# Patient Record
Sex: Male | Born: 1945 | Race: White | Hispanic: No
Health system: Southern US, Community
[De-identification: ages and names within clinical notes are randomized; demographics above are authoritative.]

---

## 2011-03-11 ENCOUNTER — Ambulatory Visit: Payer: Self-pay | Admitting: Internal Medicine

## 2011-05-04 ENCOUNTER — Ambulatory Visit: Payer: Self-pay | Admitting: Oncology

## 2011-05-07 ENCOUNTER — Ambulatory Visit: Payer: Self-pay | Admitting: Internal Medicine

## 2011-05-10 ENCOUNTER — Ambulatory Visit: Payer: Self-pay | Admitting: Internal Medicine

## 2011-05-13 ENCOUNTER — Ambulatory Visit: Payer: Self-pay | Admitting: Oncology

## 2011-05-20 ENCOUNTER — Ambulatory Visit: Payer: Self-pay | Admitting: Oncology

## 2011-05-24 LAB — PATHOLOGY REPORT

## 2011-06-03 ENCOUNTER — Ambulatory Visit: Payer: Self-pay | Admitting: Oncology

## 2011-07-04 ENCOUNTER — Ambulatory Visit: Payer: Self-pay | Admitting: Oncology

## 2011-08-03 ENCOUNTER — Ambulatory Visit: Payer: Self-pay | Admitting: Oncology

## 2011-09-03 ENCOUNTER — Ambulatory Visit: Payer: Self-pay | Admitting: Oncology

## 2011-09-05 LAB — PROTIME-INR
INR: 1.7
Prothrombin Time: 19.7 secs — ABNORMAL HIGH (ref 11.5–14.7)

## 2011-09-12 LAB — PROTIME-INR
INR: 2
Prothrombin Time: 22.6 secs — ABNORMAL HIGH (ref 11.5–14.7)

## 2011-09-19 LAB — PROTIME-INR: INR: 2.1

## 2011-09-26 LAB — CBC CANCER CENTER
Basophil #: 0 x10 3/mm (ref 0.0–0.1)
Eosinophil %: 14.7 %
HGB: 12.3 g/dL — ABNORMAL LOW (ref 13.0–18.0)
Lymphocyte #: 0.3 x10 3/mm — ABNORMAL LOW (ref 1.0–3.6)
Lymphocyte %: 13.7 %
MCH: 31.3 pg (ref 26.0–34.0)
Monocyte #: 0.3 x10 3/mm (ref 0.0–0.7)
Neutrophil %: 54.2 %
Platelet: 188 x10 3/mm (ref 150–440)
RBC: 3.94 10*6/uL — ABNORMAL LOW (ref 4.40–5.90)
WBC: 2 x10 3/mm — CL (ref 3.8–10.6)

## 2011-09-26 LAB — COMPREHENSIVE METABOLIC PANEL
Alkaline Phosphatase: 86 U/L (ref 50–136)
Bilirubin,Total: 0.4 mg/dL (ref 0.2–1.0)
Calcium, Total: 8 mg/dL — ABNORMAL LOW (ref 8.5–10.1)
Chloride: 102 mmol/L (ref 98–107)
Co2: 28 mmol/L (ref 21–32)
EGFR (African American): >60
EGFR (Non-African Amer.): >60
Glucose: 114 mg/dL — ABNORMAL HIGH (ref 65–99)
Osmolality: 276 (ref 275–301)
SGOT(AST): 18 U/L (ref 15–37)
SGPT (ALT): 23 U/L
Sodium: 138 mmol/L (ref 136–145)

## 2011-09-26 LAB — PROTIME-INR
INR: 2.5
Prothrombin Time: 27.3 secs — ABNORMAL HIGH (ref 11.5–14.7)

## 2011-09-27 LAB — PROT IMMUNOELECTROPHORES(ARMC)

## 2011-10-03 LAB — PROTIME-INR: INR: 2

## 2011-10-04 ENCOUNTER — Ambulatory Visit: Payer: Self-pay | Admitting: Oncology

## 2011-10-10 LAB — PROTIME-INR
INR: 3.5
Prothrombin Time: 34.8 secs — ABNORMAL HIGH (ref 11.5–14.7)

## 2011-10-23 LAB — CBC CANCER CENTER
Basophil #: 0 x10 3/mm (ref 0.0–0.1)
Basophil %: 0.2 %
Eosinophil #: 0 x10 3/mm (ref 0.0–0.7)
Eosinophil %: 0.2 %
HGB: 11.6 g/dL — ABNORMAL LOW (ref 13.0–18.0)
Lymphocyte %: 12.9 %
Lymphocytes: 11 %
MCH: 30.7 pg (ref 26.0–34.0)
MCHC: 34.3 g/dL (ref 32.0–36.0)
Monocytes: 7 %
Neutrophil %: 83.1 %
RDW: 18.1 % — ABNORMAL HIGH (ref 11.5–14.5)

## 2011-10-24 LAB — CBC CANCER CENTER
Basophil #: 0 x10 3/mm (ref 0.0–0.1)
Eosinophil #: 0 x10 3/mm (ref 0.0–0.7)
HCT: 32 % — ABNORMAL LOW (ref 40.0–52.0)
Lymphocyte %: 11.8 %
MCHC: 33.7 g/dL (ref 32.0–36.0)
Monocyte %: 8.3 %
Neutrophil #: 2.4 x10 3/mm (ref 1.4–6.5)
Neutrophil %: 78.2 %
Platelet: 105 x10 3/mm — ABNORMAL LOW (ref 150–440)
RDW: 18.1 % — ABNORMAL HIGH (ref 11.5–14.5)
WBC: 3.1 x10 3/mm — ABNORMAL LOW (ref 3.8–10.6)

## 2011-10-24 LAB — COMPREHENSIVE METABOLIC PANEL
Albumin: 2.4 g/dL — ABNORMAL LOW (ref 3.4–5.0)
Anion Gap: 8 (ref 7–16)
Calcium, Total: 8.3 mg/dL — ABNORMAL LOW (ref 8.5–10.1)
Chloride: 101 mmol/L (ref 98–107)
Co2: 27 mmol/L (ref 21–32)
EGFR (African American): >60
EGFR (Non-African Amer.): 56 — ABNORMAL LOW
Glucose: 169 mg/dL — ABNORMAL HIGH (ref 65–99)
Osmolality: 282 (ref 275–301)
Potassium: 3.9 mmol/L (ref 3.5–5.1)
SGOT(AST): 31 U/L (ref 15–37)
SGPT (ALT): 61 U/L
Sodium: 136 mmol/L (ref 136–145)

## 2011-10-24 LAB — PROTIME-INR: Prothrombin Time: 59.7 secs — ABNORMAL HIGH (ref 11.5–14.7)

## 2011-10-25 LAB — URINE IEP, RANDOM

## 2011-10-25 LAB — PROT IMMUNOELECTROPHORES(ARMC)

## 2011-10-31 LAB — PROTIME-INR: INR: 1.7

## 2011-11-01 ENCOUNTER — Ambulatory Visit: Payer: Self-pay | Admitting: Oncology

## 2011-11-04 ENCOUNTER — Ambulatory Visit: Payer: Self-pay | Admitting: Oncology

## 2011-11-04 LAB — PROTIME-INR: INR: 2.2

## 2011-11-07 LAB — PROTIME-INR
INR: 2.7
Prothrombin Time: 28.6 secs — ABNORMAL HIGH (ref 11.5–14.7)

## 2011-11-08 LAB — RAPID INFLUENZA A&B ANTIGENS

## 2011-11-08 LAB — PROTIME-INR
INR: 2.8
Prothrombin Time: 29.4 secs — ABNORMAL HIGH (ref 11.5–14.7)

## 2011-11-08 LAB — CBC
HCT: 15.6 % — ABNORMAL LOW (ref 40.0–52.0)
HGB: 5.5 g/dL — ABNORMAL LOW (ref 13.0–18.0)
MCH: 31.1 pg (ref 26.0–34.0)
MCV: 89 fL (ref 80–100)
Platelet: 6 10*3/uL — CL (ref 150–440)
RBC: 1.76 10*6/uL — ABNORMAL LOW (ref 4.40–5.90)
WBC: 2.8 10*3/uL — ABNORMAL LOW (ref 3.8–10.6)

## 2011-11-08 LAB — COMPREHENSIVE METABOLIC PANEL
Alkaline Phosphatase: 81 U/L (ref 50–136)
BUN: 14 mg/dL (ref 7–18)
Bilirubin,Total: 0.5 mg/dL (ref 0.2–1.0)
Chloride: 99 mmol/L (ref 98–107)
Creatinine: 1.3 mg/dL (ref 0.60–1.30)
EGFR (African American): >60
Potassium: 3.7 mmol/L (ref 3.5–5.1)
Total Protein: 9.5 g/dL — ABNORMAL HIGH (ref 6.4–8.2)

## 2011-11-09 ENCOUNTER — Inpatient Hospital Stay: Payer: Self-pay | Admitting: Oncology

## 2011-11-09 LAB — DIFFERENTIAL
Eosinophil: 1 %
Lymphocytes: 27 %
Metamyelocyte: 1 %
Monocytes: 3 %
Segmented Neutrophils: 38 %
Variant Lymphocyte - H1-Rlymph: 27 %

## 2011-11-09 LAB — URINALYSIS, COMPLETE
Bacteria: NONE SEEN
Bilirubin,UR: NEGATIVE
Leukocyte Esterase: NEGATIVE
Nitrite: NEGATIVE
Ph: 6 (ref 4.5–8.0)
RBC,UR: 37 /HPF (ref 0–5)
Squamous Epithelial: NONE SEEN

## 2011-11-09 LAB — HEMOGLOBIN
HGB: 6.5 g/dL — ABNORMAL LOW (ref 13.0–18.0)
HGB: 6.8 g/dL — ABNORMAL LOW (ref 13.0–18.0)

## 2011-11-10 LAB — CBC WITH DIFFERENTIAL/PLATELET
Bands: 6 %
HCT: 19.3 % — ABNORMAL LOW (ref 40.0–52.0)
MCH: 30.1 pg (ref 26.0–34.0)
MCHC: 34.8 g/dL (ref 32.0–36.0)
MCV: 87 fL (ref 80–100)
Monocytes: 2 %
Platelet: 47 10*3/uL — ABNORMAL LOW (ref 150–440)
RBC: 2.23 10*6/uL — ABNORMAL LOW (ref 4.40–5.90)
Segmented Neutrophils: 22 %

## 2011-11-11 LAB — BASIC METABOLIC PANEL
Anion Gap: 10 (ref 7–16)
BUN: 8 mg/dL (ref 7–18)
Calcium, Total: 7.4 mg/dL — ABNORMAL LOW (ref 8.5–10.1)
Chloride: 101 mmol/L (ref 98–107)
Co2: 25 mmol/L (ref 21–32)
EGFR (African American): >60
EGFR (Non-African Amer.): >60
Osmolality: 270 (ref 275–301)
Potassium: 3.8 mmol/L (ref 3.5–5.1)

## 2011-11-11 LAB — PROTIME-INR: INR: 1.9

## 2011-11-11 LAB — PLATELET COUNT: Platelet: 38 10*3/uL — ABNORMAL LOW (ref 150–440)

## 2011-11-11 LAB — VANCOMYCIN, TROUGH: Vancomycin, Trough: 14 ug/mL (ref 10–20)

## 2011-11-12 LAB — PROT IMMUNOELECTROPHORES(ARMC)

## 2011-11-12 LAB — CBC WITH DIFFERENTIAL/PLATELET
Basophil #: 0 10*3/uL (ref 0.0–0.1)
Basophil %: 0.6 %
Eosinophil #: 0.1 10*3/uL (ref 0.0–0.7)
Eosinophil %: 2.3 %
HCT: 21.6 % — ABNORMAL LOW (ref 40.0–52.0)
HGB: 7.4 g/dL — ABNORMAL LOW (ref 13.0–18.0)
Lymphocyte #: 0.8 10*3/uL — ABNORMAL LOW (ref 1.0–3.6)
Lymphocyte %: 28 %
MCH: 30.2 pg (ref 26.0–34.0)
MCHC: 34.4 g/dL (ref 32.0–36.0)
MCV: 88 fL (ref 80–100)
Monocyte #: 0.1 10*3/uL (ref 0.0–0.7)
Monocyte %: 4.4 %
Neutrophil #: 1.9 10*3/uL (ref 1.4–6.5)
Neutrophil %: 64.7 %
Platelet: 23 10*3/uL — CL (ref 150–440)
RBC: 2.47 10*6/uL — ABNORMAL LOW (ref 4.40–5.90)
RDW: 16.5 % — ABNORMAL HIGH (ref 11.5–14.5)
WBC: 2.8 10*3/uL — ABNORMAL LOW (ref 3.8–10.6)

## 2011-11-12 LAB — COMPREHENSIVE METABOLIC PANEL
Albumin: 1.6 g/dL — ABNORMAL LOW (ref 3.4–5.0)
Alkaline Phosphatase: 90 U/L (ref 50–136)
Anion Gap: 10 (ref 7–16)
BUN: 8 mg/dL (ref 7–18)
Bilirubin,Total: 0.6 mg/dL (ref 0.2–1.0)
Calcium, Total: 7.2 mg/dL — ABNORMAL LOW (ref 8.5–10.1)
Chloride: 101 mmol/L (ref 98–107)
Co2: 24 mmol/L (ref 21–32)
Creatinine: 0.9 mg/dL (ref 0.60–1.30)
EGFR (African American): >60
EGFR (Non-African Amer.): >60
Glucose: 96 mg/dL (ref 65–99)
Osmolality: 268 (ref 275–301)
Potassium: 3.6 mmol/L (ref 3.5–5.1)
SGOT(AST): 65 U/L — ABNORMAL HIGH (ref 15–37)
SGPT (ALT): 16 U/L
Sodium: 135 mmol/L — ABNORMAL LOW (ref 136–145)
Total Protein: 9.2 g/dL — ABNORMAL HIGH (ref 6.4–8.2)

## 2011-11-12 LAB — VANCOMYCIN, RANDOM: Vancomycin, Random: 14 ug/mL

## 2011-11-13 LAB — COMPREHENSIVE METABOLIC PANEL
Albumin: 1.5 g/dL — ABNORMAL LOW (ref 3.4–5.0)
BUN: 10 mg/dL (ref 7–18)
Bilirubin,Total: 0.6 mg/dL (ref 0.2–1.0)
Creatinine: 0.84 mg/dL (ref 0.60–1.30)
EGFR (African American): >60
EGFR (Non-African Amer.): >60
Glucose: 106 mg/dL — ABNORMAL HIGH (ref 65–99)
Potassium: 4 mmol/L (ref 3.5–5.1)
SGOT(AST): 57 U/L — ABNORMAL HIGH (ref 15–37)
SGPT (ALT): 17 U/L
Total Protein: 9.6 g/dL — ABNORMAL HIGH (ref 6.4–8.2)

## 2011-11-13 LAB — CBC WITH DIFFERENTIAL/PLATELET
Basophil #: 0 10*3/uL (ref 0.0–0.1)
Basophil %: 0.9 %
Eosinophil %: 1.9 %
HCT: 25.4 % — ABNORMAL LOW (ref 40.0–52.0)
HGB: 8.6 g/dL — ABNORMAL LOW (ref 13.0–18.0)
HGB: 8.9 g/dL — ABNORMAL LOW (ref 13.0–18.0)
Lymphocyte #: 1.1 10*3/uL (ref 1.0–3.6)
Lymphocyte %: 35.1 %
MCH: 29.9 pg (ref 26.0–34.0)
MCHC: 34.1 g/dL (ref 32.0–36.0)
MCV: 88 fL (ref 80–100)
MCV: 88 fL (ref 80–100)
Monocyte %: 13.4 %
Neutrophil %: 48.7 %
Platelet: 15 10*3/uL — CL (ref 150–440)
RBC: 2.89 10*6/uL — ABNORMAL LOW (ref 4.40–5.90)
RBC: 2.88 10*6/uL — ABNORMAL LOW (ref 4.40–5.90)
RDW: 16.6 % — ABNORMAL HIGH (ref 11.5–14.5)
RDW: 15.9 % — ABNORMAL HIGH (ref 11.5–14.5)
Segmented Neutrophils: 23 %
Variant Lymphocyte - H1-Rlymph: 46 %
WBC: 3.2 10*3/uL — ABNORMAL LOW (ref 3.8–10.6)
WBC: 3 10*3/uL — ABNORMAL LOW (ref 3.8–10.6)

## 2011-11-13 LAB — URINALYSIS, COMPLETE
Glucose,UR: NEGATIVE mg/dL (ref 0–75)
Ketone: NEGATIVE
Nitrite: NEGATIVE
Ph: 7 (ref 4.5–8.0)
Specific Gravity: 1.006 (ref 1.003–1.030)
WBC UR: 1 /HPF (ref 0–5)

## 2011-11-14 LAB — COMPREHENSIVE METABOLIC PANEL
Albumin: 1.6 g/dL — ABNORMAL LOW (ref 3.4–5.0)
Anion Gap: 9 (ref 7–16)
BUN: 12 mg/dL (ref 7–18)
Calcium, Total: 7.1 mg/dL — ABNORMAL LOW (ref 8.5–10.1)
EGFR (African American): >60
EGFR (Non-African Amer.): >60
Glucose: 103 mg/dL — ABNORMAL HIGH (ref 65–99)
Potassium: 4.2 mmol/L (ref 3.5–5.1)
SGOT(AST): 52 U/L — ABNORMAL HIGH (ref 15–37)
Sodium: 139 mmol/L (ref 136–145)

## 2011-11-14 LAB — CBC WITH DIFFERENTIAL/PLATELET
HCT: 23.3 % — ABNORMAL LOW (ref 40.0–52.0)
Lymphocytes: 34 %
MCV: 88 fL (ref 80–100)
Platelet: 22 10*3/uL — CL (ref 150–440)
RBC: 2.66 10*6/uL — ABNORMAL LOW (ref 4.40–5.90)
RDW: 16.2 % — ABNORMAL HIGH (ref 11.5–14.5)
Segmented Neutrophils: 13 %
Variant Lymphocyte - H1-Rlymph: 45 %
WBC: 2.8 10*3/uL — ABNORMAL LOW (ref 3.8–10.6)

## 2011-11-14 LAB — CULTURE, BLOOD (SINGLE)

## 2011-11-14 LAB — LACTATE DEHYDROGENASE: LDH: 1455 U/L — ABNORMAL HIGH (ref 87–241)

## 2011-12-02 ENCOUNTER — Ambulatory Visit: Payer: Self-pay | Admitting: Oncology

## 2011-12-02 DEATH — deceased

## 2012-10-25 IMAGING — CR DG CHEST 1V PORT
1 series · 1 of 1 positions shown · non-contrast
Comparison: none

REASON FOR EXAM: cough
COMMENTS:

[portable]
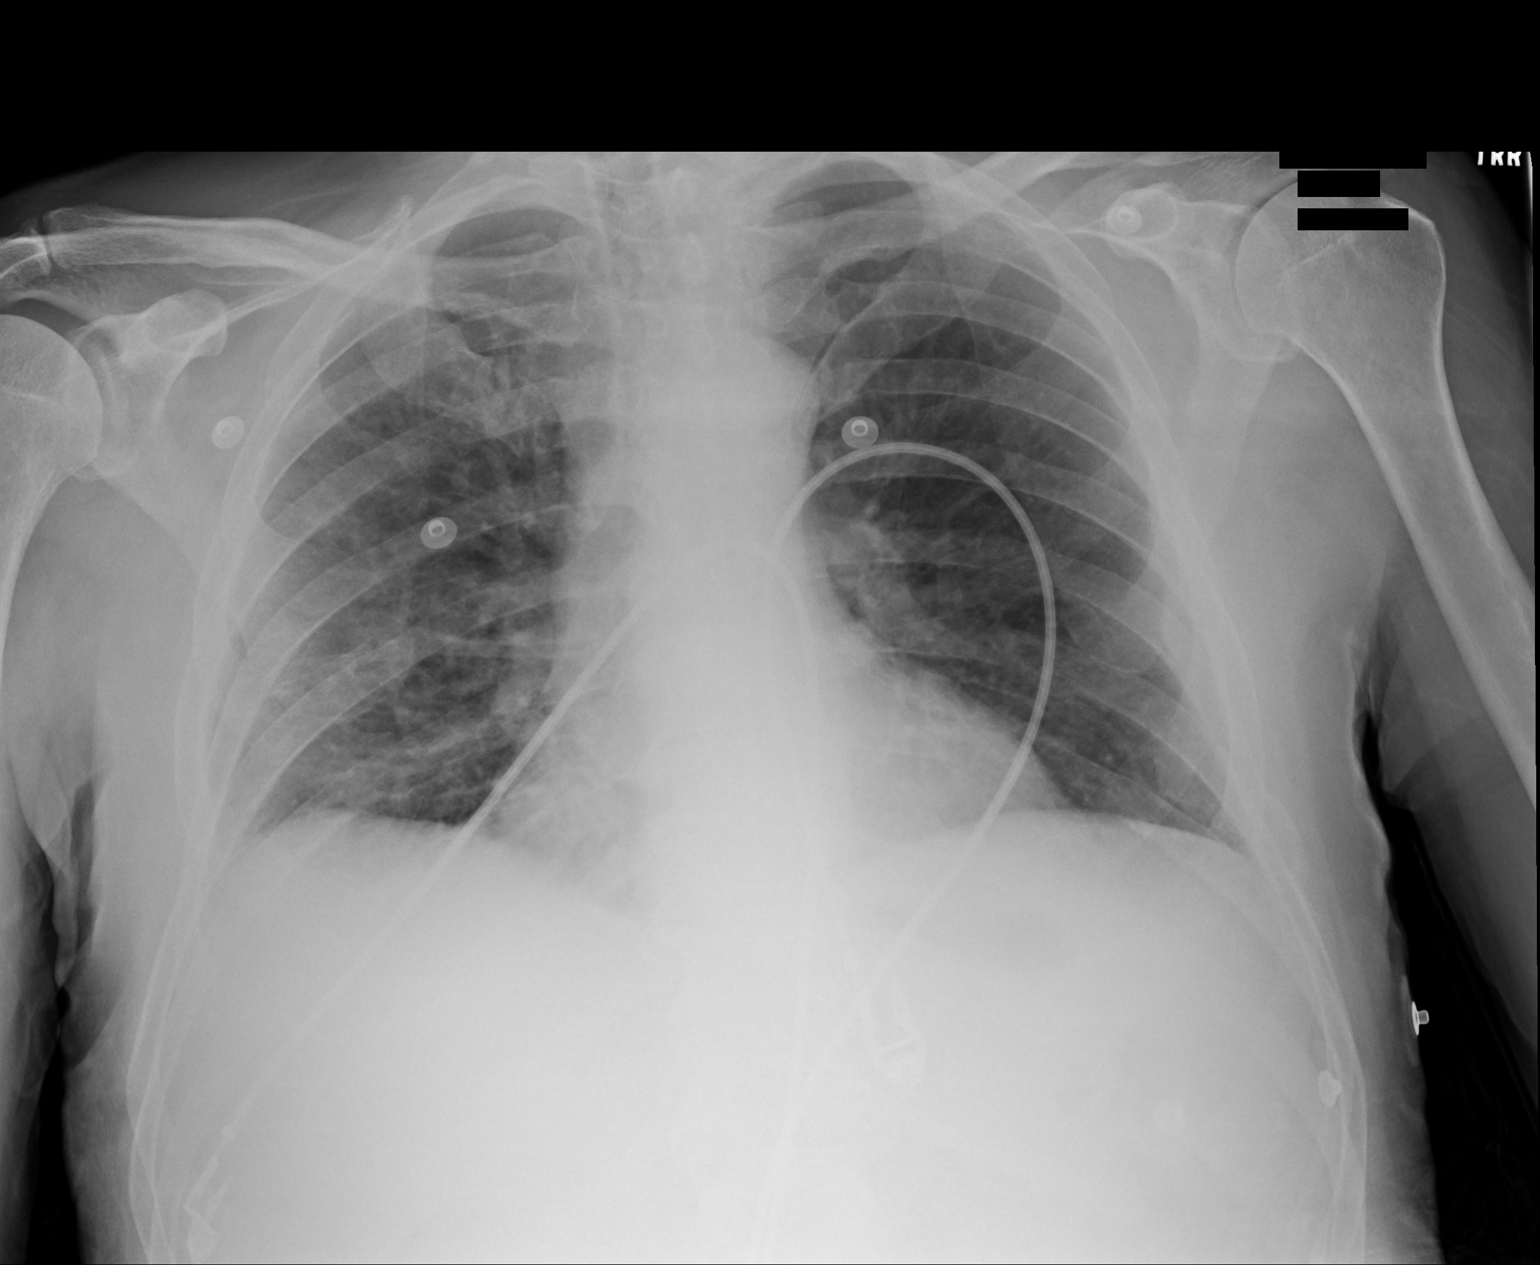

[1 of 1 positions shown; findings below may reference images not displayed]

PROCEDURE:     DXR - DXR PORTABLE CHEST SINGLE VIEW  - November 08, 2011 [DATE]

RESULT:     The patient has taken a shallow inspiration. With technique
taken into consideration there is mild increased density within the left
hemithorax. This may be secondary to technique though an infiltrate cannot
be excluded and repeat PA and lateral views is recommended. The cardiac
silhouette and visualized bony skeleton are grossly unremarkable.
IMPRESSION: 1. Shallow inspiration.
2. Findings likely secondary to technique though there is mild increased
density in the left hemithorax as described above.

## 2012-10-28 IMAGING — CR DG CHEST 2V
1 series · 2 of 2 positions shown · non-contrast
Comparison: none

REASON FOR EXAM: fever
COMMENTS:

PROCEDURE:     DXR - DXR CHEST PA (OR AP) AND LATERAL  - November 11, 2011 [DATE]
RESULT:     There is mild diffuse thickening of the interstitial lung
markings bilaterally. The findings are compatible with atelectasis or
pneumonia. Heart size is upper limits for normal or mildly enlarged.

[Series 1: x chest ap · 0.14mm/px · 2 of 2 slices shown]
[im 1/2]
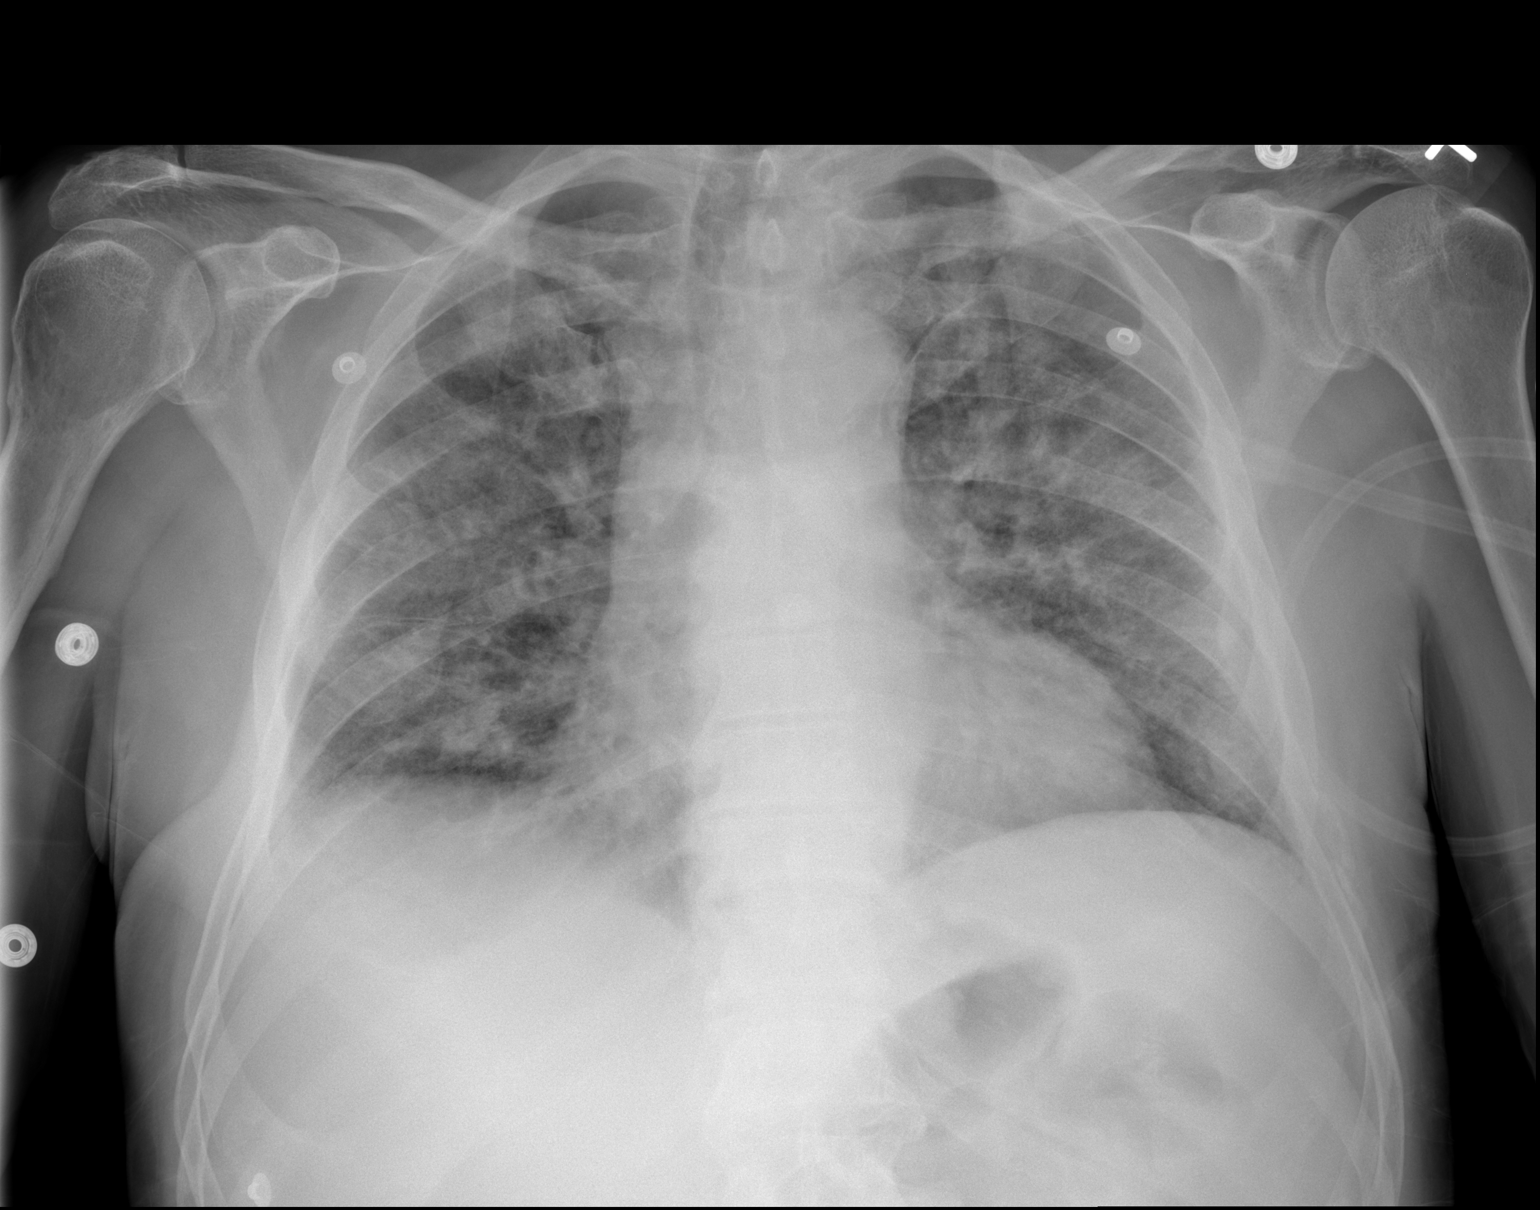
[im 2/2]
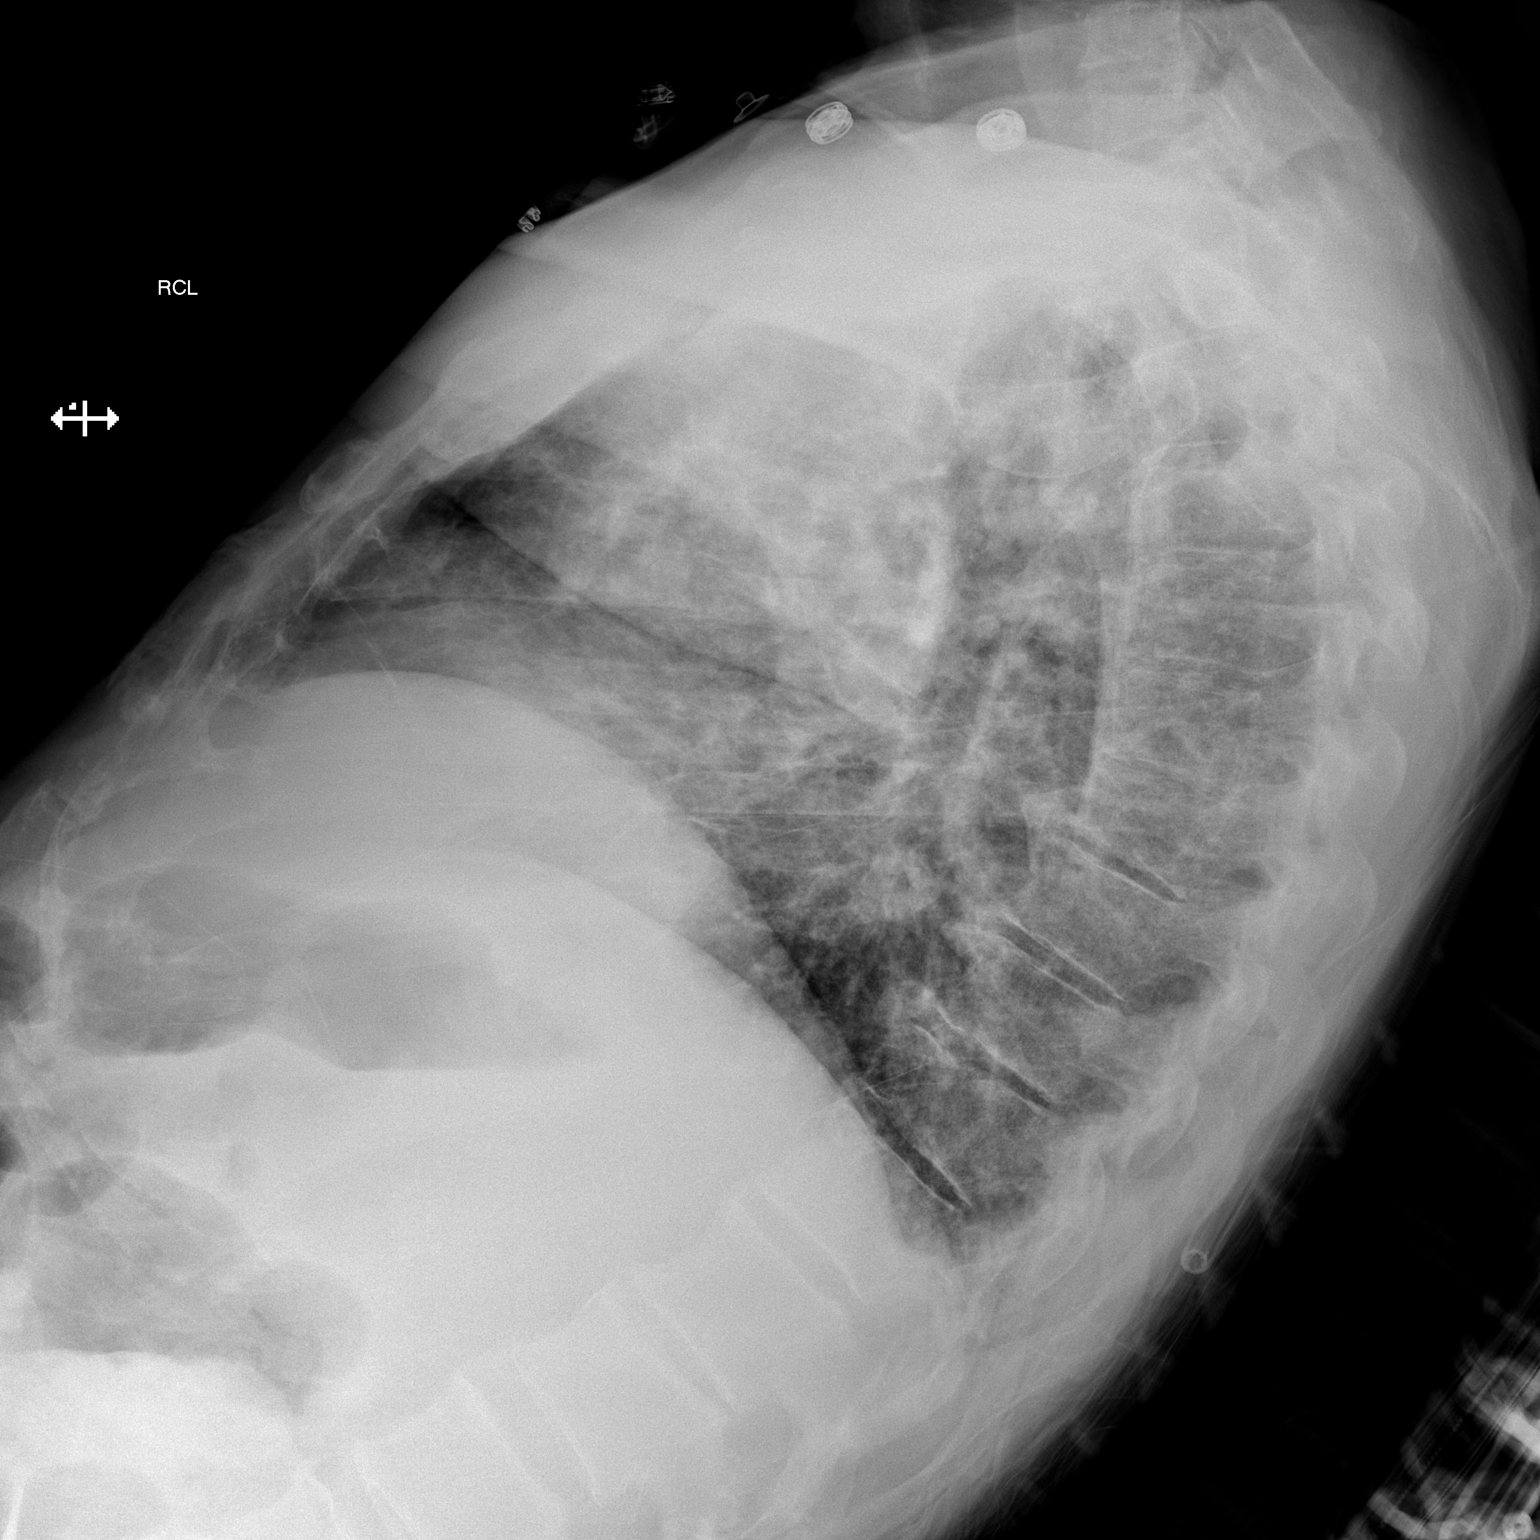

[2 of 2 positions shown; findings below may reference images not displayed]

IMPRESSION: There has developed thickening of the interstitial lung markings bilaterally
compatible with atelectasis or pneumonia and for which additional followup
is recommended.

## 2012-10-31 IMAGING — CR DG CHEST 2V
1 series · 2 of 2 positions shown · non-contrast
Comparison: none

REASON FOR EXAM: pneumonia
COMMENTS:

[Series 1: ap · 0.17mm/px · 2 of 2 slices shown]
[im 1/2]
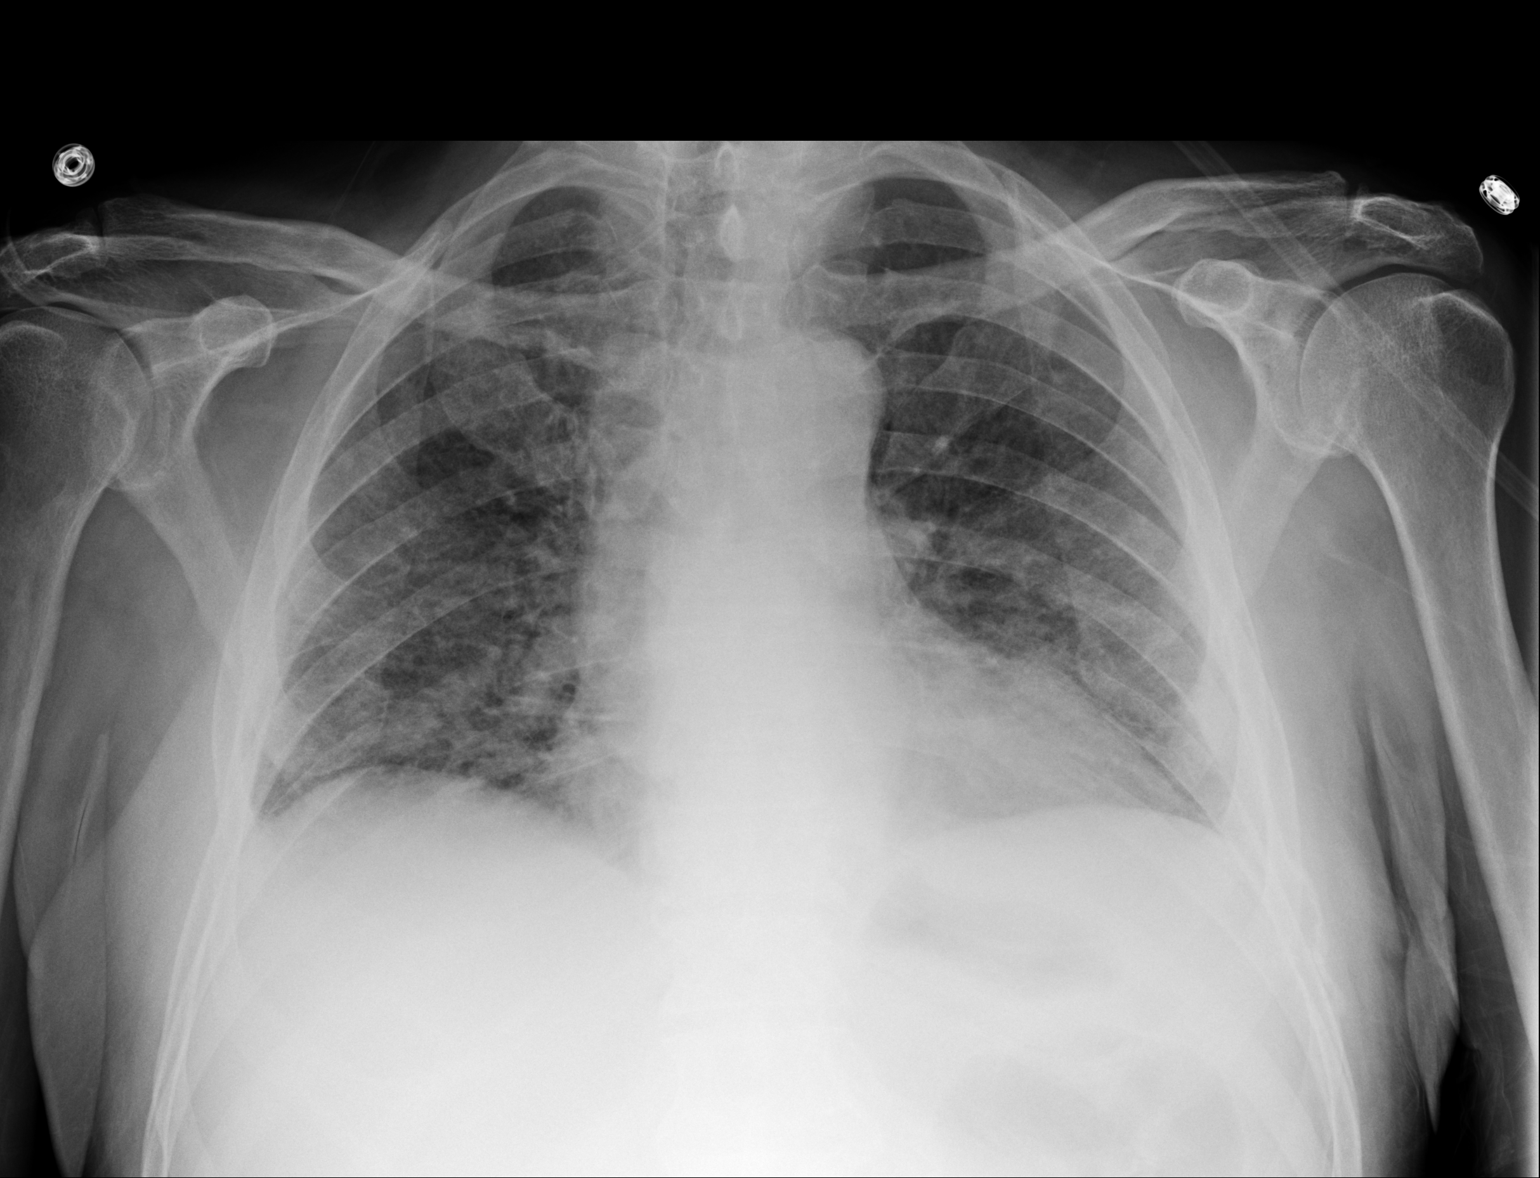
[im 2/2]
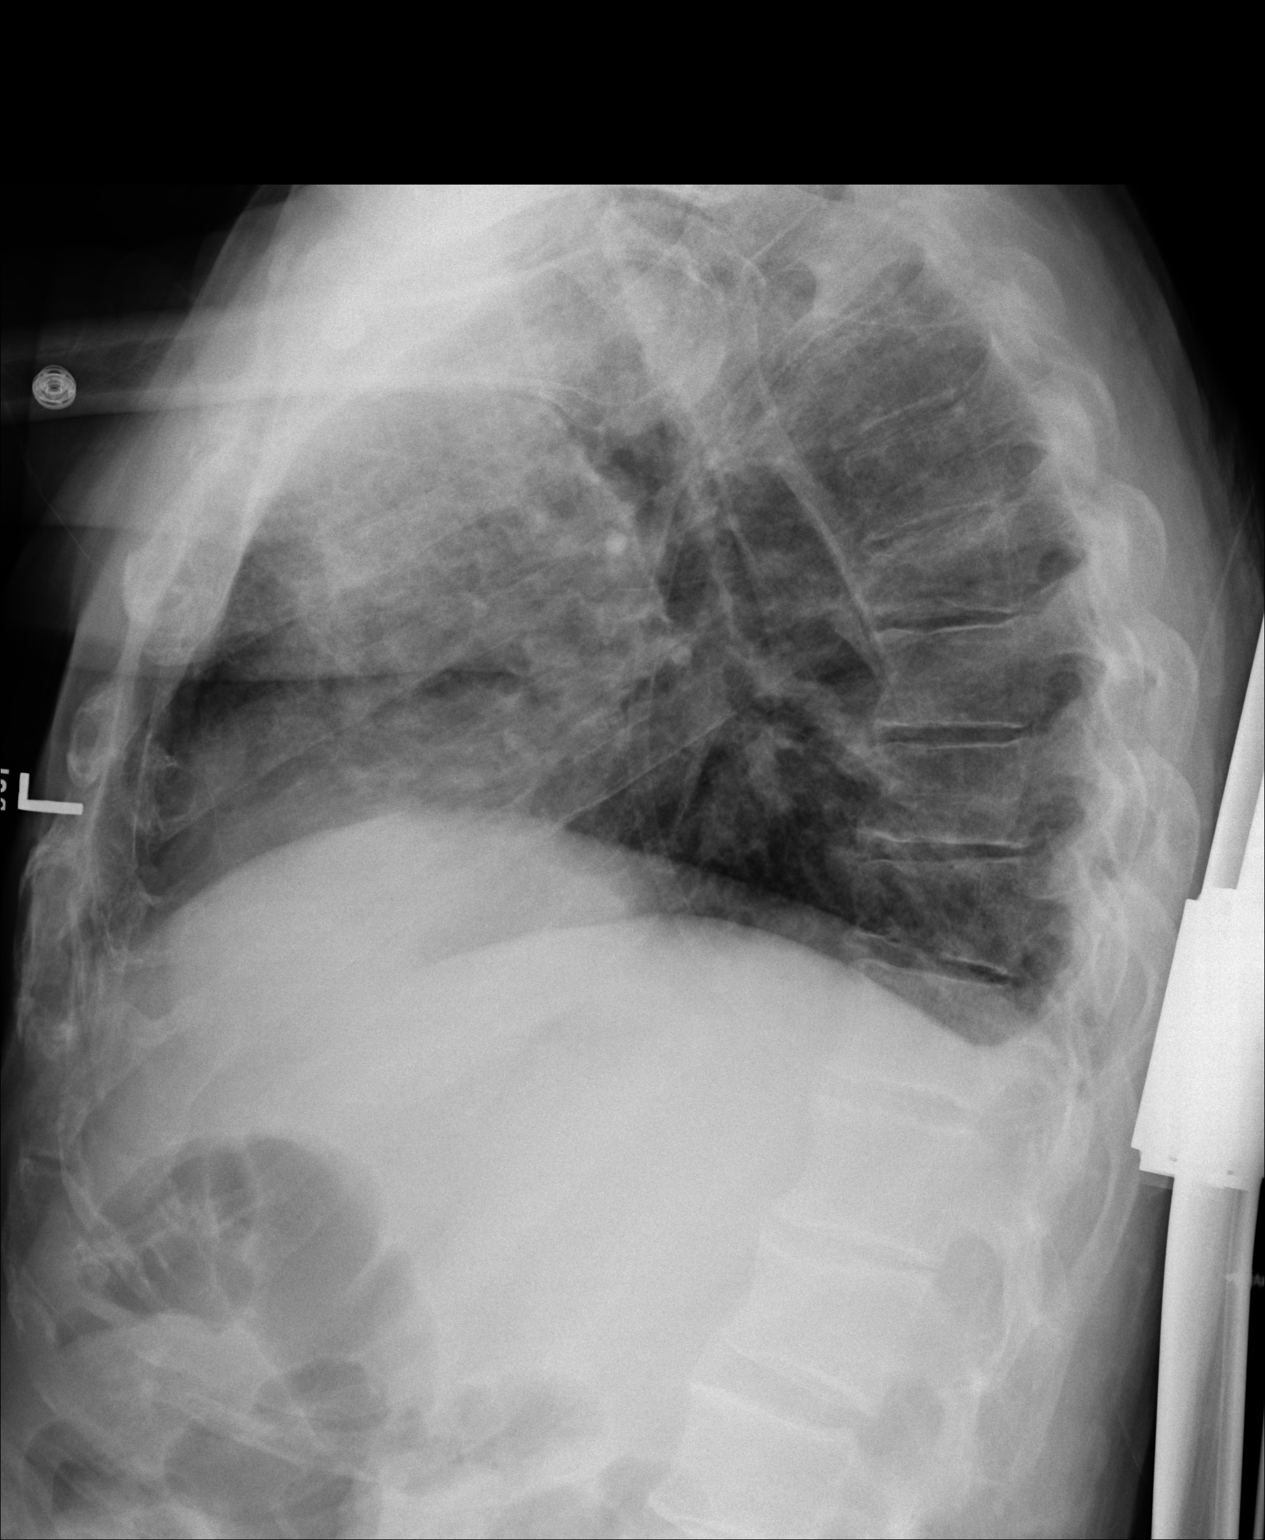

[2 of 2 positions shown; findings below may reference images not displayed]

PROCEDURE:     DXR - DXR CHEST PA (OR AP) AND LATERAL  - November 14, 2011 [DATE]

RESULT:     Comparison is made to the prior exam of 11/12/2011. There is mild
thickening of the interstitial lung markings bilaterally, essentially
unchanged as compared to the exam of 11/12/2011 but improved as compared exam
of 11/11/2011. No new pulmonary infiltrates are seen. The heart appears
mildly enlarged but is stable in size as compared to the prior exam. No
acute bony abnormalities are seen.
IMPRESSION: 1. There is minimal stable thickening of the interstitial lung markings
consistent with minimal residual interstitial edema that has improved
significantly since the exam of 11/11/2011.
2. No new pulmonary infiltrates are seen.
3. Stable cardiomegaly.

## 2014-12-25 NOTE — H&P (Signed)
PATIENT NAME:  Timothy Russo, Timothy Russo MR#:  [id number] DATE OF BIRTH:  October 30, 1945  DATE OF ADMISSION:  11/09/2011  PRIMARY CARE PHYSICIAN: Dr. Emily Filbert  CHIEF COMPLAINT: Generalized weakness and nosebleed.   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 The patient started having nosebleed since Sunday, that his six days ago; however, over the last couple of days he is getting weaker and reached the level that he cannot function or walk. Also his wife noted petechiae on his lower extremities. Patient was brought to the Emergency Department for evaluation and work-up here revealed significant anemia with hemoglobin of 5, platelets are very low reaching 6000 and white blood cell count is down to 2800. He also noted to have fever but no focus of infection.    REVIEW OF SYSTEMS: CONSTITUTIONAL: He has fever. No chills. No night sweats. He admits having severe fatigue. EYES: No blurring of vision. No double vision. ENT: No hearing impairment. No sore throat. No dysphagia but he has epistaxis for the last six days. CARDIOVASCULAR: No chest pain. No shortness of breath. No edema. No syncope. RESPIRATORY: No cough. No sputum production. No chest pain. No shortness of breath. GASTROINTESTINAL: No abdominal pain. No vomiting. No hematemesis. No melena or hematochezia. GENITOURINARY: No dysuria or frequency of urination. MUSCULOSKELETAL: No joint pain or swelling. No muscular pain or swelling. INTEGUMENTARY: No skin rash. No ulcers other than the lower extremity petechiae. NEUROLOGY: No focal weakness. No seizure activity. No headache. PSYCHIATRY: No anxiety. No depression. ENDOCRINE: Denies any heat or cold intolerance. No night sweats. HEMATOLOGIC: He has petechiae on his extremities.  No ecchymosis   PAST MEDICAL HISTORY:  1. History of multiple myeloma with thoracic spine metastasis.   2. 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   FAMILY HISTORY: Reporting positive family history for stomach cancer and breast cancer referring to an aunt and an uncle.   SOCIAL HISTORY: He is married, living with his wife. He is a retired Dealer.   SOCIAL HABITS: Nonsmoker. No history of alcohol or drug abuse.   ADMISSION MEDICATIONS:  1. Revlimid 25 mg once a day He takes this medication once a day for three weeks and then one week off.  2. Coumadin 5 mg once a day. 3. Venlafaxine 37.5 mg a day. 4. Oxycodone 10 mg every four hours p.r.n. for his back pain.  5. Dexamethasone 4 mg, he takes 10 tablets once a week on Tuesday.   ALLERGIES: Penicillin; type of reaction is it makes him feel bad and that was a long time ago.   PHYSICAL EXAMINATION:  VITAL SIGNS: Blood pressure 91/54, respiratory rate 18, pulse 77, temperature 101.2, oxygen saturation 98%.   GENERAL APPEARANCE: Elderly male laying in bed in no acute distress.   HEAD AND NECK EXAMINATION: Unremarkable except for mild pallor. No icterus. No cyanosis.   ENT: Hearing was normal. There is no active bleeding from the nose right now. Small bruise noted on the right side corner of his mouth not actively bleeding. Tongue is normal.   EYES: Normal iris and conjunctivae. Pupils about 4 mm, equal and reactive.   NECK: Supple. Trachea at midline. No thyromegaly. No cervical lymphadenopathy. No masses.   HEART: Normal S1, S2. No S3, S4. No murmur. No gallop. No carotid bruits.   RESPIRATORY: Normal breathing pattern without use of accessory  muscles. No rales. No wheezing.   ABDOMEN: Soft without tenderness. No hepatosplenomegaly. No masses. No hernias.   SKIN: No ulcers. No subcutaneous nodules. There are a few scattered petechiae on lower extremities.   MUSCULOSKELETAL: No joint swelling. No clubbing.   NEUROLOGIC: Cranial nerves II through XII  are intact. No focal motor deficits.   PSYCHIATRIC: Patient is alert, oriented. Mood and affect were flat.   LABORATORY, DIAGNOSTIC, AND RADIOLOGICAL DATA: Chest x-ray showed no acute cardiopulmonary abnormality. EKG showed sinus tachycardia at rate of 115 per minute otherwise unremarkable EKG. Serum glucose 108, BUN 14, creatinine 1.3, sodium 134, potassium 3.7, total calcium 7.3, however, albumin 1.6, total protein 9.5. LDH was elevated 1281. AST 54, ALT 16, bilirubin 0.5. CBC showed white count 2800, hemoglobin 5.5, hematocrit 15, platelet count 6000. MCV, MCH, MCHC were normal. Prothrombin time 29 with INR 2.8. APTT 41. Urinalysis was unremarkable except for +3 blood.   ASSESSMENT:  1. Pancytopenia. All elements of blood are low.  a. Anemia with hemoglobin down to 5 likely secondary to combination of multifactorial, the chemotherapy, the multiple myeloma and the nosebleed; all are contributory factor to his level of hemoglobin.  b. Leukopenia. White; count is down to 2800; however, his neutrophil total count is 1064.  c. Severe thrombocytopenia with platelet count at 6000. The patient right now is not actively bleeding.  2. Fever without having a source of infection, likely secondary to his underlying low neutrophil count. Nevertheless, cannot rule out underlying infection or sepsis. Blood cultures and urine culture were taken and patient will be started on antibiotics.  3. History of multiple myeloma with metastasis to the thoracic spine. 4. Epistaxis.  5. Hypoalbuminemia.  6. Elevated LDH likely secondary to tumor lysis, unlikely secondary to hemolysis since the bilirubin is normal.  7. Chronic anticoagulation with Coumadin.  8. Past history of deep vein thrombosis and pulmonary embolism. The prothrombin time is therapeutic.   PLAN: Admit the patient to the Intensive Care Unit. IV hydration to stabilize blood pressure. Blood transfusion 2 units of packed red blood cells were ordered. I was  contemplating to transfuse platelets but finally I decided to wait and see while holding his chemotherapy, Revlimid. I spoke later with Dr. Grayland Ormond and he advised to go ahead and transfuse 2 units of plateletes then check platelete count in one hour post transfusion. Cultures were taken. These are two blood cultures and one urine culture. Empiric IV antibiotic using meropenem and vancomycin. Oncology consultation with Dr. Grayland Ormond who is familiar with his case. Finally, I will hold Coumadin given the magnitude of his anemia and the thrombocytopenia. This can be resumed later. For peptic ulcer disease prophylaxis, I placed him on Protonix 40 mg daily. For deep vein thrombosis prophylaxis he is already anticoagulated with Coumadin.   TIME NEEDED TO EVALUATE THIS PATIENT: Took more than 60 minutes.  ____________________________ Clovis Pu. Lenore Manner, MD amd:cms D: 11/09/2011 03:31:47 ET T: 11/09/2011 09:42:07 ET JOB#: [id number]  cc: Clovis Pu. Lenore Manner, MD, <Dictator> Rusty Aus, MD Mike Craze Irven Coe MD ELECTRONICALLY SIGNED 11/09/2011 22:24
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
